# Patient Record
Sex: Male | Born: 1995 | Race: White | Hispanic: No | Marital: Single | Smoking: Current every day smoker
Health system: Southern US, Community
[De-identification: ages and names within clinical notes are randomized; demographics above are authoritative.]

## PROBLEM LIST (undated history)

## (undated) DIAGNOSIS — F411 Generalized anxiety disorder: Secondary | ICD-10-CM

## (undated) DIAGNOSIS — F1910 Other psychoactive substance abuse, uncomplicated: Secondary | ICD-10-CM

---

## 2017-09-12 ENCOUNTER — Emergency Department (HOSPITAL_COMMUNITY)
Admission: EM | Admit: 2017-09-12 | Discharge: 2017-09-12 | Disposition: A | Discharge status: Home / Self Care | Payer: BLUE CROSS/BLUE SHIELD | Attending: Emergency Medicine | Admitting: Emergency Medicine

## 2017-09-12 ENCOUNTER — Emergency Department (HOSPITAL_COMMUNITY): Payer: BLUE CROSS/BLUE SHIELD

## 2017-09-12 ENCOUNTER — Encounter (HOSPITAL_COMMUNITY): Payer: Self-pay | Admitting: Emergency Medicine

## 2017-09-12 DIAGNOSIS — B9789 Other viral agents as the cause of diseases classified elsewhere: Secondary | ICD-10-CM

## 2017-09-12 DIAGNOSIS — Z5321 Procedure and treatment not carried out due to patient leaving prior to being seen by health care provider: Secondary | ICD-10-CM | POA: Insufficient documentation

## 2017-09-12 DIAGNOSIS — J069 Acute upper respiratory infection, unspecified: Secondary | ICD-10-CM

## 2017-09-12 DIAGNOSIS — R05 Cough: Secondary | ICD-10-CM | POA: Diagnosis not present

## 2017-09-12 HISTORY — DX: Generalized anxiety disorder: F41.1

## 2017-09-12 MED ORDER — SODIUM CHLORIDE 0.9 % IV BOLUS (SEPSIS): 1000.0000 mL | Freq: Once | INTRAVENOUS | Status: DC

## 2017-09-12 MED ORDER — BENZONATATE 100 MG PO CAPS: 100.0000 mg | ORAL_CAPSULE | Freq: Once | ORAL | Status: DC

## 2017-09-12 MED ORDER — ONDANSETRON HCL 4 MG/2ML IJ SOLN: 4.0000 mg | Freq: Once | INTRAMUSCULAR | Status: DC

## 2017-09-12 MED ORDER — KETOROLAC TROMETHAMINE 30 MG/ML IJ SOLN: 30.0000 mg | Freq: Once | INTRAMUSCULAR | Status: DC

## 2017-09-12 NOTE — ED Provider Notes (Signed)
[facility]-EMERGENCY DEPT Provider Note   CSN: [id number] Arrival date & time: 09/12/17  4098     History   Chief Complaint Chief Complaint  Patient presents with  . Nausea  . Nasal Congestion  . Migraine    HPI Matthew Hale is a 21 y.o. male.  HPI Matthew Hale is a 21 y.o. male with hx of anxiety, presents to ED with complaint of a headache, nausea, vomiting, congestion, sore throat cough. States symptoms began yesterday. Reports night sweats. Unable to keep fluids down this morning. Reports diffuse headache, dizziness, states "i am seeing black spots." no neck pain or stiffness. Did not take any meds prior to coming in. Nothing making symptoms better or worse. Tearful, states "i am very scared about what this may be."   Past Medical History:  Diagnosis Date  . Generalized anxiety disorder     There are no active problems to display for this patient.   History reviewed. No pertinent surgical history.     Home Medications    Prior to Admission medications   Medication Sig Start Date End Date Taking? Authorizing Provider  amphetamine-dextroamphetamine (ADDERALL) 10 MG tablet Take 10-20 mg by mouth 2 (two) times daily. Take  by mouth in the morning and  by mouth at 2pm 06/19/17  Yes [provider]  Pseudoephedrine-Ibuprofen (ADVIL COLD & SINUS LIQUI-GELS) 30-200 MG CAPS Take 2 capsules by mouth every 6 (six) hours as needed (cold, congestion).   Yes [provider]    Family History History reviewed. No pertinent family history.  Social History Social History  Substance Use Topics  . Smoking status: Current Every Day Smoker    Packs/day: 0.50    Types: Cigarettes  . Smokeless tobacco: Never Used  . Alcohol use Yes     Allergies   Patient has no known allergies.   Review of Systems Review of Systems  Constitutional: Positive for chills, diaphoresis and fever.  HENT: Positive for congestion and sore throat.   Eyes: Negative for  photophobia.  Respiratory: Positive for cough, chest tightness and shortness of breath.   Cardiovascular: Positive for chest pain. Negative for palpitations and leg swelling.  Gastrointestinal: Positive for abdominal pain, nausea and vomiting. Negative for abdominal distention and diarrhea.  Genitourinary: Negative for dysuria, frequency, hematuria and urgency.  Musculoskeletal: Positive for myalgias. Negative for arthralgias, neck pain and neck stiffness.  Skin: Negative for rash.  Allergic/Immunologic: Negative for immunocompromised state.  Neurological: Positive for dizziness, light-headedness and headaches. Negative for weakness and numbness.  All other systems reviewed and are negative.    Physical Exam Updated Vital Signs BP (!) 121/91 (BP Location: Left Arm)   Pulse (!) 121   Temp 98.3 F (36.8 C) (Oral)   Resp 16   SpO2 93%   Physical Exam  Constitutional: He is oriented to person, place, and time. He appears well-developed and well-nourished. No distress.  HENT:  Head: Normocephalic and atraumatic.  Right Ear: External ear normal.  Left Ear: External ear normal.  Mouth/Throat: Oropharynx is clear and moist.  Clear rhinorrhea, pharynx erythemous, uvula midline  Eyes: Conjunctivae are normal.  Neck: Normal range of motion. Neck supple.  No meningeal signs  Cardiovascular: Normal rate, regular rhythm and normal heart sounds.   Pulmonary/Chest: Effort normal and breath sounds normal. No respiratory distress. He has no wheezes. He has no rales.  Abdominal: Soft. Bowel sounds are normal. He exhibits no distension. There is no tenderness. There is no rebound.  Musculoskeletal:  He exhibits no edema or tenderness.  Lymphadenopathy:    He has no cervical adenopathy.  Neurological: He is alert and oriented to person, place, and time.  Skin: Skin is warm and dry. No erythema.  Psychiatric: He has a normal mood and affect.  Nursing note and vitals reviewed.    ED Treatments  / Results  Labs (all labs ordered are listed, but only abnormal results are displayed) Labs Reviewed  CBC WITH DIFFERENTIAL/PLATELET  INFLUENZA PANEL BY PCR (TYPE A & B)  I-STAT CHEM 8, ED    EKG  EKG Interpretation None       Radiology No results found.  Procedures Procedures (including critical care time)  Medications Ordered in ED Medications  sodium chloride 0.9 % bolus 1,000 mL (not administered)  benzonatate (TESSALON) capsule 100 mg (not administered)  ketorolac (TORADOL) 30 MG/ML injection 30 mg (not administered)     Initial Impression / Assessment and Plan / ED Course  I have reviewed the triage vital signs and the nursing notes.  Pertinent labs & imaging results that were available during my care of the patient were reviewed by me and considered in my medical decision making (see chart for details).     Pt with flu like symptoms. Very tearful, appears anxious. tachycardic with HR in 120s. Will give iv fluids, check basic electrolytes. Give toradol for headache, zofran for nausea, tessalon for cough.   9:54 AM Patient is in the hallway, screening that he's getting a poor service. I went in to speak with the patient, he apparently is upset because his nurse came in and was "rude" to him. He stated that she went ahead to draw labs and fluids as I ordered, however he decided he did not want this test done. After he told her to the nurse, he stated that the nurse laughed at him and walked out of the room. He is asking to speak with a service representative because we are providing him poor service. I tried to explain to him that the nurse was probably not rude but just that she walked out because he refused testing came to talk to me. Patient is upset, but I tried to calm him down and he did initially decide to stay and get some testing done.  I asked the charge nurse to come and talk to him, she did go in and speak with the patient and again after talking to her, he  refused all the testing and decided to leave the ED with no treatment.  Vitals:   09/12/17 0830  BP: (!) 121/91  Pulse: (!) 121  Resp: 16  Temp: 98.3 F (36.8 C)  TempSrc: Oral  SpO2: 93%     Final Clinical Impressions(s) / ED Diagnoses   Final diagnoses:  Viral URI with cough    New Prescriptions New Prescriptions   No medications on file     Jaynie Crumble, PA-C 09/12/17 1610    Rolan Bucco, MD 09/12/17 1000

## 2017-09-12 NOTE — ED Triage Notes (Signed)
Pt with cough and sinus congestion x several days. Pt states he had nausea / vomiting since yesterday, chills. Pt reports productive cough with yellow sputum.

## 2017-09-12 NOTE — ED Notes (Signed)
PATIENT LEFT WITHOUT BEING SEEN

## 2017-09-12 NOTE — ED Notes (Signed)
Pt refused IV and blood draw and wanting to leave per Gabriel Earing, Charity fundraiser. Tatyana, PA enter pt room explaining risks leaving AMA. This Clinical research associate entered pt room to address pt concerns. Pt verbalizes "does not want care because it is expressive; that is way I didn't want the blood drawn; they didn't listen to me." Pt made aware financials can be arranged at a later time; that he care is important at this moment. Pt not easily redirected and continues to verbalize "but they did not listen and were rude." Pt continues to refuse care; pt left AMA.

## 2017-09-22 ENCOUNTER — Emergency Department (HOSPITAL_BASED_OUTPATIENT_CLINIC_OR_DEPARTMENT_OTHER): Payer: BLUE CROSS/BLUE SHIELD

## 2017-09-22 ENCOUNTER — Encounter (HOSPITAL_BASED_OUTPATIENT_CLINIC_OR_DEPARTMENT_OTHER): Payer: Self-pay | Admitting: Emergency Medicine

## 2017-09-22 ENCOUNTER — Emergency Department (HOSPITAL_BASED_OUTPATIENT_CLINIC_OR_DEPARTMENT_OTHER)
Admission: EM | Admit: 2017-09-22 | Discharge: 2017-09-22 | Disposition: A | Discharge status: Home / Self Care | Payer: BLUE CROSS/BLUE SHIELD | Attending: Emergency Medicine | Admitting: Emergency Medicine

## 2017-09-22 DIAGNOSIS — Z79899 Other long term (current) drug therapy: Secondary | ICD-10-CM | POA: Insufficient documentation

## 2017-09-22 DIAGNOSIS — J40 Bronchitis, not specified as acute or chronic: Secondary | ICD-10-CM | POA: Diagnosis not present

## 2017-09-22 DIAGNOSIS — R05 Cough: Secondary | ICD-10-CM | POA: Diagnosis present

## 2017-09-22 DIAGNOSIS — F17210 Nicotine dependence, cigarettes, uncomplicated: Secondary | ICD-10-CM | POA: Diagnosis not present

## 2017-09-22 LAB — CBC WITH DIFFERENTIAL/PLATELET
BASOS ABS: 0 10*3/uL (ref 0.0–0.1)
BASOS PCT: 0 %
Eosinophils Absolute: 0.1 10*3/uL (ref 0.0–0.7)
Eosinophils Relative: 1 %
HEMATOCRIT: 44 % (ref 39.0–52.0)
HEMOGLOBIN: 15.3 g/dL (ref 13.0–17.0)
LYMPHS PCT: 14 %
Lymphs Abs: 2.2 10*3/uL (ref 0.7–4.0)
MCH: 31.2 pg (ref 26.0–34.0)
MCHC: 34.8 g/dL (ref 30.0–36.0)
MCV: 89.6 fL (ref 78.0–100.0)
MONO ABS: 0.7 10*3/uL (ref 0.1–1.0)
Monocytes Relative: 5 %
NEUTROS ABS: 13.2 10*3/uL — AB (ref 1.7–7.7)
NEUTROS PCT: 80 %
Platelets: 270 10*3/uL (ref 150–400)
RBC: 4.91 MIL/uL (ref 4.22–5.81)
RDW: 12.2 % (ref 11.5–15.5)
WBC: 16.4 10*3/uL — AB (ref 4.0–10.5)

## 2017-09-22 MED ORDER — ONDANSETRON HCL 4 MG PO TABS: 4.0000 mg | ORAL_TABLET | Freq: Three times a day (TID) | ORAL | 0 refills | Status: DC | PRN

## 2017-09-22 MED ORDER — ALBUTEROL SULFATE HFA 108 (90 BASE) MCG/ACT IN AERS
1.0000 | INHALATION_SPRAY | RESPIRATORY_TRACT | Status: DC | PRN
  Administered 2017-09-22: 2 via RESPIRATORY_TRACT
  Filled 2017-09-22: qty 6.7

## 2017-09-22 MED ORDER — BENZONATATE 100 MG PO CAPS: 100.0000 mg | ORAL_CAPSULE | Freq: Three times a day (TID) | ORAL | 0 refills | Status: DC

## 2017-09-22 MED ORDER — IPRATROPIUM-ALBUTEROL 0.5-2.5 (3) MG/3ML IN SOLN
3.0000 mL | Freq: Once | RESPIRATORY_TRACT | Status: AC
  Administered 2017-09-22: 3 mL via RESPIRATORY_TRACT
  Filled 2017-09-22: qty 3

## 2017-09-22 MED ORDER — ONDANSETRON 8 MG PO TBDP
8.0000 mg | ORAL_TABLET | Freq: Once | ORAL | Status: AC
  Administered 2017-09-22: 8 mg via ORAL
  Filled 2017-09-22: qty 1

## 2017-09-22 NOTE — Discharge Instructions (Signed)
Please read and follow all provided instructions.  Your diagnoses today include:  1. Bronchitis     Tests performed today include: Vital signs. See below for your results today.  CXR - no signs of bacterial infection  Blood counts - reassuring  Medications prescribed/advised:  1. Musinex [Guaifenesin] as a decongestant [thin mucus - you have to be well hydrated when taking this for it to work] - can get over the counter 2. Tylenol for fever/pain and Motrin/Ibuprofen for muscle aches  3. Albuterol inhaler - this medication will help open up your airway Use inhaler as follows: 1-2 puffs with spacer every 4 hours as needed for wheezing, cough, or shortness of breath.  4. Tessalon Cough Suppressant: Take as prescribed.  5. Anti-nausea medication (Zofran): Take as needed for nausea up to every 8 hours.   Home care instructions:  An upper respiratory infection (URI) is also sometimes known as the common cold. Most people improve within 1 week, but symptoms can last up to 2 weeks. A residual cough may last even longer.   URI is most commonly caused by a virus. Viruses are NOT treated with antibiotics. You can easily spread the virus to others by oral contact. This includes kissing, sharing a glass, coughing, or sneezing. Touching your mouth or nose and then touching a surface, which is then touched by another person, can also spread the virus.   TREATMENT  Treatment is directed at relieving symptoms. There is no cure. Antibiotics are not effective, because the infection is caused by a virus, not by bacteria. Treatment may include:  Increased fluid intake. Sports drinks offer valuable electrolytes, sugars, and fluids.  Breathing heated mist or steam (vaporizer or shower).  Eating chicken soup or other clear broths, and maintaining good nutrition.  Getting plenty of rest.  Using gargles or lozenges for comfort.  Controlling fevers with ibuprofen or acetaminophen as directed by your caregiver.   Increasing usage of your inhaler if you have asthma.  Return to work when your temperature has returned to normal.   Follow-up instructions: Followup with your primary care doctor in 4 days if your symptoms persist.  Your more than welcome to return to the emergency department if symptoms worsen or become concerning.  Return instructions:  Please return to the Emergency Department if you do not get better, if you get worse, or new symptoms OR  - Fever (temperature greater than 101.26F)  - Bleeding that does not stop with holding pressure to the area    -Severe pain (please note that you may be more sore the day after your accident)  - Chest Pain  - Difficulty breathing (worsening shortness of breath with sputum production may  be a sign of pneumonia.   - Severe nausea or vomiting  - Inability to tolerate food and liquids  - Passing out  - Skin becoming red around your wounds  - Change in mental status (confusion or lethargy)  - New numbness or weakness     -You develop fever, swollen neck glands, pain with swallowing or white areas on the back of your throat. This may be a sign of strep throat.  Please return if you have any other emergent concerns.  Additional Information:  Your vital signs today were: BP 125/74 (BP Location: Left Arm)    Pulse 60    Temp 97.7 F (36.5 C) (Oral)    Resp 16    Ht  (1.778 m)    Wt 96.2 kg (212 lb)  SpO2 100%    BMI 30.42 kg/m  If your blood pressure (BP) was elevated above 135/85 this visit, please have this repeated by your doctor within one month.

## 2017-09-22 NOTE — ED Notes (Signed)
Patient transported to X-ray 

## 2017-09-22 NOTE — ED Triage Notes (Addendum)
Pt with cough and sinus congestion x several days, patient states that he went to his dr earlier this week and was given multiple RX but has not had the money to pick it up. He can not remember what they were. He reports that he was coughing today and had some blood in his sputum x 1 today  - Patient it very anxious and restless in triage. Reports that he was not sure if the blood was coming from his sputum or from his stomach. Patient is dry heaving in triage. PAtient vomiting  / coughing in triage and states that he is gagging from the sputum

## 2017-09-22 NOTE — ED Notes (Signed)
Pt given d/c instructions as per chart. Rx x 2. Verbalizes understanding. No questions. 

## 2017-09-22 NOTE — ED Provider Notes (Signed)
[facility]   CSN: [id number] Arrival date & time: 09/22/17  1928     History   Chief Complaint Chief Complaint  Patient presents with  . Cough    HPI Matthew Hale is a 21 y.o. male presents to the emergency department today for a 10 day history of cough. Was seen in the emergency department on 09/12/17 but left before getting complete workup. States that he has had productive cough with clear/green sputum, sinus congestion, rhinorrhea since 09/12/17. Was seen by his PCP for this on Thursday and was given albuterol inhaler for wheezing but was unable to fill the medication due to costs. Today patient cough turned to retching and he has had 1-2 episodes of blood streaked sputum afterlong episodes of dry heaving. He says that he has had nausea from increased sputum which has been causing him to gag. The patient has not had bloody nose, abdominal pain, emesis. Denies fever, chills, myalgia's, arthralgia's, night sweats, weight loss, travel, chest pain, SOB, DOE, lower extremity swelling, recent surgery, history of cancer, or recent immobilization. 1/2 pack smoker daily. No new medications or use of ACE-I. No relation to food. No history of asthma, COPD, or CHF. Patient is not immunocompromised (HIV, chronic steroid use, etc.). No IVDU.   HPI  Past Medical History:  Diagnosis Date  . Generalized anxiety disorder     There are no active problems to display for this patient.   History reviewed. No pertinent surgical history.     Home Medications    Prior to Admission medications   Medication Sig Start Date End Date Taking? Authorizing Provider  amphetamine-dextroamphetamine (ADDERALL) 10 MG tablet Take 10-20 mg by mouth 2 (two) times daily. Take  by mouth in the morning and  by mouth at 2pm 06/19/17   [provider]  Pseudoephedrine-Ibuprofen (ADVIL COLD & SINUS LIQUI-GELS) 30-200 MG CAPS Take 2 capsules by mouth every 6 (six) hours as  needed (cold, congestion).    [provider]    Family History History reviewed. No pertinent family history.  Social History Social History  Substance Use Topics  . Smoking status: Current Every Day Smoker    Packs/day: 0.50    Types: Cigarettes  . Smokeless tobacco: Never Used  . Alcohol use Yes     Allergies   Patient has no known allergies.   Review of Systems Review of Systems  All other systems reviewed and are negative.    Physical Exam Updated Vital Signs BP (!) 114/99 (BP Location: Left Arm)   Pulse 80   Temp 97.7 F (36.5 C) (Oral)   Resp 18   Ht  (1.778 m)   Wt 96.2 kg (212 lb)   SpO2 98%   BMI 30.42 kg/m   Physical Exam  Constitutional: He appears well-developed and well-nourished.  HENT:  Head: Normocephalic and atraumatic.  Right Ear: Hearing, tympanic membrane, external ear and ear canal normal.  Left Ear: Hearing, tympanic membrane, external ear and ear canal normal.  Nose: Mucosal edema and rhinorrhea present. Right sinus exhibits no maxillary sinus tenderness and no frontal sinus tenderness. Left sinus exhibits no maxillary sinus tenderness and no frontal sinus tenderness.  Mouth/Throat: Uvula is midline, oropharynx is clear and moist and mucous membranes are normal. No oropharyngeal exudate, posterior oropharyngeal edema, posterior oropharyngeal erythema or tonsillar abscesses. No tonsillar exudate.  [place]  Eyes: Pupils are equal, round, and reactive to light. Right eye exhibits no discharge. Left eye exhibits no  discharge. No scleral icterus.  Neck: Trachea normal. Neck supple. No spinous process tenderness present. No neck rigidity. Normal range of motion present.  No meningismus  Cardiovascular: Normal rate, regular rhythm and intact distal pulses.   No murmur heard. Pulses:      Radial pulses are 2+ on the right side, and 2+ on the left side.       Dorsalis pedis pulses are 2+ on the right side, and 2+ on the left  side.       Posterior tibial pulses are 2+ on the right side, and 2+ on the left side.  No lower extremity swelling or edema. Calves symmetric in size bilaterally.  Pulmonary/Chest: Effort normal. He has no decreased breath sounds. He has wheezes (expiratory). He has no rhonchi. He has no rales. He exhibits no tenderness.  Abdominal: Soft. Bowel sounds are normal. There is no tenderness. There is no rebound and no guarding.  Musculoskeletal: He exhibits no edema.  Lymphadenopathy:    He has no cervical adenopathy.  Neurological: He is alert.  Skin: Skin is warm and dry. No rash noted. He is not diaphoretic.  No petechiae or purpura  Psychiatric: He has a normal mood and affect.  Nursing note and vitals reviewed.    ED Treatments / Results  Labs (all labs ordered are listed, but only abnormal results are displayed) Labs Reviewed - No data to display  EKG  EKG Interpretation None       Radiology Dg Chest 2 View  Result Date: 09/22/2017 CLINICAL DATA:  Productive cough with some blood in it today. EXAM: CHEST  2 VIEW COMPARISON:  None. FINDINGS: The lungs are clear. The pulmonary vasculature is normal. Heart size is normal. Hilar and mediastinal contours are unremarkable. There is no pleural effusion. IMPRESSION: No active cardiopulmonary disease. Electronically Signed   By: Ellery Plunk M.D.   On: 09/22/2017 20:12    Procedures Procedures (including critical care time)  Medications Ordered in ED Medications - No data to display   Initial Impression / Assessment and Plan / ED Course  I have reviewed the triage vital signs and the nursing notes.  Pertinent labs & imaging results that were available during my care of the patient were reviewed by me and considered in my medical decision making (see chart for details).     Patient here with cough and sinus congestion x 10 days. Was seen day symptoms began but left before getting workup. Patient seen by PCP earlier in  the week and given inhaler rx but could not fill it due to cost. Today notes nausea, retching and 1-2 bouts of small amounts of blood in sputum. Vital signs are reassuring. The patient does have some wheezing that is appreciated on exam. There is no meningismus on exam. No petechiae or purpura on skin exam. No abdominal TTP. Patient given breathing treatment with improvement of wheezing. Zofran improved nausea and no more episodes of retching or hemoptysis while patient in department. CBC without anemia and platelet count wnl. CXR negative. Blood likely due to small Mallory-Weiss tears from retching. Will send patient home with nausea control and symptomatic therapy for viral bronchitis. Albuterol inhaler given in the department. The evaluation does not show pathology that would require ongoing emergent intervention or inpatient treatment. I advised the patient to follow-up with PCP this week. I advised the patient to return to the emergency department with new or worsening symptoms or new concerns. Specific return precautions discussed. The patient verbalized understanding  and agreement with plan. All questions answered. No further questions at this time. The patient is hemodynamically stable, mentating appropriately and appears safe for discharge.  Patient case discussed with Dr. Rubin Payor who is in agreement with plan.  Final Clinical Impressions(s) / ED Diagnoses   Final diagnoses:  Bronchitis    New Prescriptions New Prescriptions   No medications on file     Princella Pellegrini 09/23/17 1456    Benjiman Core, MD 09/23/17 2214

## 2018-03-11 ENCOUNTER — Inpatient Hospital Stay (HOSPITAL_COMMUNITY)
Admission: EM | Admit: 2018-03-11 | Discharge: 2018-03-15 | DRG: 964 | Disposition: A | Discharge status: Home / Self Care | Payer: BLUE CROSS/BLUE SHIELD | Attending: General Surgery | Admitting: General Surgery

## 2018-03-11 ENCOUNTER — Emergency Department (HOSPITAL_COMMUNITY): Payer: BLUE CROSS/BLUE SHIELD

## 2018-03-11 ENCOUNTER — Encounter (HOSPITAL_COMMUNITY): Payer: Self-pay | Admitting: Radiology

## 2018-03-11 DIAGNOSIS — S36115A Moderate laceration of liver, initial encounter: Principal | ICD-10-CM | POA: Diagnosis present

## 2018-03-11 DIAGNOSIS — R402142 Coma scale, eyes open, spontaneous, at arrival to emergency department: Secondary | ICD-10-CM | POA: Diagnosis present

## 2018-03-11 DIAGNOSIS — F4310 Post-traumatic stress disorder, unspecified: Secondary | ICD-10-CM | POA: Diagnosis present

## 2018-03-11 DIAGNOSIS — J942 Hemothorax: Secondary | ICD-10-CM

## 2018-03-11 DIAGNOSIS — S299XXA Unspecified injury of thorax, initial encounter: Secondary | ICD-10-CM

## 2018-03-11 DIAGNOSIS — S271XXA Traumatic hemothorax, initial encounter: Secondary | ICD-10-CM | POA: Diagnosis present

## 2018-03-11 DIAGNOSIS — S31119A Laceration without foreign body of abdominal wall, unspecified quadrant without penetration into peritoneal cavity, initial encounter: Secondary | ICD-10-CM | POA: Diagnosis present

## 2018-03-11 DIAGNOSIS — D62 Acute posthemorrhagic anemia: Secondary | ICD-10-CM | POA: Diagnosis present

## 2018-03-11 DIAGNOSIS — R0602 Shortness of breath: Secondary | ICD-10-CM

## 2018-03-11 DIAGNOSIS — S21231A Puncture wound without foreign body of right back wall of thorax without penetration into thoracic cavity, initial encounter: Secondary | ICD-10-CM | POA: Diagnosis not present

## 2018-03-11 DIAGNOSIS — F411 Generalized anxiety disorder: Secondary | ICD-10-CM | POA: Diagnosis present

## 2018-03-11 DIAGNOSIS — R402362 Coma scale, best motor response, obeys commands, at arrival to emergency department: Secondary | ICD-10-CM | POA: Diagnosis present

## 2018-03-11 DIAGNOSIS — F17210 Nicotine dependence, cigarettes, uncomplicated: Secondary | ICD-10-CM | POA: Diagnosis present

## 2018-03-11 DIAGNOSIS — Z9689 Presence of other specified functional implants: Secondary | ICD-10-CM

## 2018-03-11 DIAGNOSIS — R402242 Coma scale, best verbal response, confused conversation, at arrival to emergency department: Secondary | ICD-10-CM | POA: Diagnosis present

## 2018-03-11 DIAGNOSIS — S80212A Abrasion, left knee, initial encounter: Secondary | ICD-10-CM | POA: Diagnosis present

## 2018-03-11 DIAGNOSIS — F429 Obsessive-compulsive disorder, unspecified: Secondary | ICD-10-CM | POA: Diagnosis present

## 2018-03-11 LAB — I-STAT CHEM 8, ED
BUN: 9 mg/dL (ref 6–20)
CALCIUM ION: 1.14 mmol/L — AB (ref 1.15–1.40)
Chloride: 104 mmol/L (ref 101–111)
Creatinine, Ser: 1.1 mg/dL (ref 0.61–1.24)
GLUCOSE: 173 mg/dL — AB (ref 65–99)
HCT: 42 % (ref 39.0–52.0)
Hemoglobin: 14.3 g/dL (ref 13.0–17.0)
Potassium: 3.8 mmol/L (ref 3.5–5.1)
SODIUM: 138 mmol/L (ref 135–145)
TCO2: 20 mmol/L — AB (ref 22–32)

## 2018-03-11 LAB — CBC
HCT: 40.4 % (ref 39.0–52.0)
Hemoglobin: 13.7 g/dL (ref 13.0–17.0)
MCH: 30.6 pg (ref 26.0–34.0)
MCHC: 33.9 g/dL (ref 30.0–36.0)
MCV: 90.2 fL (ref 78.0–100.0)
Platelets: 254 10*3/uL (ref 150–400)
RBC: 4.48 MIL/uL (ref 4.22–5.81)
RDW: 12.7 % (ref 11.5–15.5)
WBC: 7.9 10*3/uL (ref 4.0–10.5)

## 2018-03-11 LAB — PROTIME-INR
INR: 1.05
Prothrombin Time: 13.6 seconds (ref 11.4–15.2)

## 2018-03-11 LAB — I-STAT CG4 LACTIC ACID, ED: LACTIC ACID, VENOUS: 7.92 mmol/L — AB (ref 0.5–1.9)

## 2018-03-11 MED ORDER — IOPAMIDOL (ISOVUE-300) INJECTION 61%
INTRAVENOUS | Status: AC
  Filled 2018-03-11: qty 100

## 2018-03-11 MED ORDER — IOPAMIDOL (ISOVUE-300) INJECTION 61%
100.0000 mL | Freq: Once | INTRAVENOUS | Status: AC | PRN
  Administered 2018-03-11: 100 mL via INTRAVENOUS

## 2018-03-11 MED ORDER — SODIUM CHLORIDE 0.9 % IV BOLUS
1000.0000 mL | Freq: Once | INTRAVENOUS | Status: AC
  Administered 2018-03-11: 1000 mL via INTRAVENOUS
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
## 2018-03-12 ENCOUNTER — Inpatient Hospital Stay (HOSPITAL_COMMUNITY): Payer: BLUE CROSS/BLUE SHIELD

## 2018-03-12 DIAGNOSIS — F411 Generalized anxiety disorder: Secondary | ICD-10-CM | POA: Diagnosis not present

## 2018-03-12 DIAGNOSIS — S36115A Moderate laceration of liver, initial encounter: Secondary | ICD-10-CM | POA: Diagnosis not present

## 2018-03-12 DIAGNOSIS — S271XXA Traumatic hemothorax, initial encounter: Secondary | ICD-10-CM | POA: Diagnosis not present

## 2018-03-12 DIAGNOSIS — F429 Obsessive-compulsive disorder, unspecified: Secondary | ICD-10-CM | POA: Diagnosis not present

## 2018-03-12 DIAGNOSIS — R402362 Coma scale, best motor response, obeys commands, at arrival to emergency department: Secondary | ICD-10-CM | POA: Diagnosis not present

## 2018-03-12 DIAGNOSIS — R402142 Coma scale, eyes open, spontaneous, at arrival to emergency department: Secondary | ICD-10-CM | POA: Diagnosis not present

## 2018-03-12 DIAGNOSIS — S31119A Laceration without foreign body of abdominal wall, unspecified quadrant without penetration into peritoneal cavity, initial encounter: Secondary | ICD-10-CM | POA: Diagnosis present

## 2018-03-12 DIAGNOSIS — R402242 Coma scale, best verbal response, confused conversation, at arrival to emergency department: Secondary | ICD-10-CM | POA: Diagnosis not present

## 2018-03-12 DIAGNOSIS — D62 Acute posthemorrhagic anemia: Secondary | ICD-10-CM | POA: Diagnosis not present

## 2018-03-12 DIAGNOSIS — F4310 Post-traumatic stress disorder, unspecified: Secondary | ICD-10-CM | POA: Diagnosis not present

## 2018-03-12 DIAGNOSIS — S21231A Puncture wound without foreign body of right back wall of thorax without penetration into thoracic cavity, initial encounter: Secondary | ICD-10-CM | POA: Diagnosis present

## 2018-03-12 DIAGNOSIS — S80212A Abrasion, left knee, initial encounter: Secondary | ICD-10-CM | POA: Diagnosis not present

## 2018-03-12 DIAGNOSIS — F17210 Nicotine dependence, cigarettes, uncomplicated: Secondary | ICD-10-CM | POA: Diagnosis not present

## 2018-03-12 LAB — TYPE AND SCREEN
ABO/RH(D): O POS
ABO/RH(D): O POS
ANTIBODY SCREEN: NEGATIVE
Antibody Screen: NEGATIVE

## 2018-03-12 LAB — URINALYSIS, ROUTINE W REFLEX MICROSCOPIC
Bilirubin Urine: NEGATIVE
GLUCOSE, UA: NEGATIVE mg/dL
Hgb urine dipstick: NEGATIVE
Ketones, ur: 5 mg/dL — AB
LEUKOCYTES UA: NEGATIVE
Nitrite: NEGATIVE
PROTEIN: NEGATIVE mg/dL
Specific Gravity, Urine: 1.039 — ABNORMAL HIGH (ref 1.005–1.030)
pH: 8 (ref 5.0–8.0)

## 2018-03-12 LAB — HIV ANTIBODY (ROUTINE TESTING W REFLEX): HIV SCREEN 4TH GENERATION: NONREACTIVE

## 2018-03-12 LAB — COMPREHENSIVE METABOLIC PANEL
ALBUMIN: 4.3 g/dL (ref 3.5–5.0)
ALK PHOS: 62 U/L (ref 38–126)
ALT: 18 U/L (ref 17–63)
ALT: 18 U/L (ref 17–63)
AST: 27 U/L (ref 15–41)
AST: 21 U/L (ref 15–41)
Albumin: 3.4 g/dL — ABNORMAL LOW (ref 3.5–5.0)
Alkaline Phosphatase: 84 U/L (ref 38–126)
Anion gap: 15 (ref 5–15)
Anion gap: 11 (ref 5–15)
BILIRUBIN TOTAL: 0.9 mg/dL (ref 0.3–1.2)
BUN: 11 mg/dL (ref 6–20)
BUN: <5 mg/dL — ABNORMAL LOW (ref 6–20)
CALCIUM: 8.3 mg/dL — AB (ref 8.9–10.3)
CHLORIDE: 112 mmol/L — AB (ref 101–111)
CHLORIDE: 102 mmol/L (ref 101–111)
CO2: 20 mmol/L — ABNORMAL LOW (ref 22–32)
CO2: 23 mmol/L (ref 22–32)
CREATININE: 1.03 mg/dL (ref 0.61–1.24)
Calcium: 9.5 mg/dL (ref 8.9–10.3)
Creatinine, Ser: 1.24 mg/dL (ref 0.61–1.24)
GFR calc Af Amer: >60 mL/min (ref >60)
GLUCOSE: 171 mg/dL — AB (ref 65–99)
Glucose, Bld: 92 mg/dL (ref 65–99)
POTASSIUM: 4.1 mmol/L (ref 3.5–5.1)
Potassium: 3.5 mmol/L (ref 3.5–5.1)
Sodium: 147 mmol/L — ABNORMAL HIGH (ref 135–145)
Sodium: 136 mmol/L (ref 135–145)
TOTAL PROTEIN: 7.5 g/dL (ref 6.5–8.1)
TOTAL PROTEIN: 5.5 g/dL — AB (ref 6.5–8.1)
Total Bilirubin: 0.5 mg/dL (ref 0.3–1.2)

## 2018-03-12 LAB — CBC
HCT: 33.4 % — ABNORMAL LOW (ref 39.0–52.0)
HCT: 26.9 % — ABNORMAL LOW (ref 39.0–52.0)
HEMOGLOBIN: 8.8 g/dL — AB (ref 13.0–17.0)
Hemoglobin: 11 g/dL — ABNORMAL LOW (ref 13.0–17.0)
MCH: 29.7 pg (ref 26.0–34.0)
MCH: 29.4 pg (ref 26.0–34.0)
MCHC: 32.9 g/dL (ref 30.0–36.0)
MCHC: 32.7 g/dL (ref 30.0–36.0)
MCV: 90.3 fL (ref 78.0–100.0)
MCV: 90 fL (ref 78.0–100.0)
PLATELETS: 196 10*3/uL (ref 150–400)
Platelets: 154 10*3/uL (ref 150–400)
RBC: 3.7 MIL/uL — ABNORMAL LOW (ref 4.22–5.81)
RBC: 2.99 MIL/uL — AB (ref 4.22–5.81)
RDW: 12.8 % (ref 11.5–15.5)
RDW: 12.9 % (ref 11.5–15.5)
WBC: 8.6 10*3/uL (ref 4.0–10.5)
WBC: 5.5 10*3/uL (ref 4.0–10.5)

## 2018-03-12 LAB — MRSA PCR SCREENING: MRSA BY PCR: NEGATIVE

## 2018-03-12 LAB — ABO/RH
ABO/RH(D): O POS
ABO/RH(D): O POS

## 2018-03-12 LAB — RAPID URINE DRUG SCREEN, HOSP PERFORMED
AMPHETAMINES: NOT DETECTED
BARBITURATES: NOT DETECTED
BENZODIAZEPINES: NOT DETECTED
Cocaine: POSITIVE — AB
Opiates: POSITIVE — AB
Tetrahydrocannabinol: POSITIVE — AB

## 2018-03-12 LAB — LACTIC ACID, PLASMA: Lactic Acid, Venous: 0.8 mmol/L (ref 0.5–1.9)

## 2018-03-12 LAB — ETHANOL

## 2018-03-12 LAB — CDS SEROLOGY

## 2018-03-12 MED ORDER — MIDAZOLAM HCL 2 MG/2ML IJ SOLN
1.0000 mg | Freq: Once | INTRAMUSCULAR | Status: AC
  Administered 2018-03-12: 1 mg via INTRAVENOUS
  Filled 2018-03-12: qty 2

## 2018-03-12 MED ORDER — OXYCODONE HCL 5 MG PO TABS
10.0000 mg | ORAL_TABLET | ORAL | Status: DC | PRN
  Administered 2018-03-12 – 2018-03-15 (×9): 10 mg via ORAL
  Filled 2018-03-12 (×10): qty 2

## 2018-03-12 MED ORDER — OXYCODONE HCL 5 MG PO TABS
5.0000 mg | ORAL_TABLET | ORAL | Status: DC | PRN
  Administered 2018-03-15: 5 mg via ORAL
  Filled 2018-03-12: qty 1

## 2018-03-12 MED ORDER — MIDAZOLAM HCL 2 MG/2ML IJ SOLN
2.0000 mg | Freq: Once | INTRAMUSCULAR | Status: AC
  Administered 2018-03-12: 2 mg via INTRAVENOUS

## 2018-03-12 MED ORDER — ONDANSETRON HCL 4 MG/2ML IJ SOLN: 4.0000 mg | Freq: Four times a day (QID) | INTRAMUSCULAR | Status: DC | PRN

## 2018-03-12 MED ORDER — FENTANYL CITRATE (PF) 100 MCG/2ML IJ SOLN
100.0000 ug | Freq: Once | INTRAMUSCULAR | Status: AC
  Administered 2018-03-12: 50 ug via INTRAVENOUS
  Filled 2018-03-12: qty 2

## 2018-03-12 MED ORDER — MORPHINE SULFATE (PF) 2 MG/ML IV SOLN
1.0000 mg | INTRAVENOUS | Status: DC | PRN
  Administered 2018-03-12: 1 mg via INTRAVENOUS
  Filled 2018-03-12: qty 1

## 2018-03-12 MED ORDER — FLUOXETINE HCL 20 MG PO CAPS
20.0000 mg | ORAL_CAPSULE | Freq: Every day | ORAL | Status: DC
  Administered 2018-03-12 – 2018-03-15 (×4): 20 mg via ORAL
  Filled 2018-03-12 (×4): qty 1

## 2018-03-12 MED ORDER — FENTANYL CITRATE (PF) 100 MCG/2ML IJ SOLN
50.0000 ug | Freq: Once | INTRAMUSCULAR | Status: AC
  Administered 2018-03-12: 50 ug via INTRAVENOUS
  Filled 2018-03-12: qty 2

## 2018-03-12 MED ORDER — ACETAMINOPHEN 325 MG PO TABS
650.0000 mg | ORAL_TABLET | Freq: Four times a day (QID) | ORAL | Status: DC
  Administered 2018-03-12 – 2018-03-15 (×15): 650 mg via ORAL
  Filled 2018-03-12 (×15): qty 2

## 2018-03-12 MED ORDER — HYDROMORPHONE HCL 1 MG/ML IJ SOLN
1.0000 mg | INTRAMUSCULAR | Status: DC | PRN
  Administered 2018-03-12: 2 mg via INTRAVENOUS
  Administered 2018-03-12: 1 mg via INTRAVENOUS
  Administered 2018-03-12: 2 mg via INTRAVENOUS
  Administered 2018-03-13: 1 mg via INTRAVENOUS
  Administered 2018-03-13: 2 mg via INTRAVENOUS
  Administered 2018-03-13 – 2018-03-15 (×6): 1 mg via INTRAVENOUS
  Filled 2018-03-12: qty 1
  Filled 2018-03-12: qty 2
  Filled 2018-03-12 (×4): qty 1
  Filled 2018-03-12 (×2): qty 2
  Filled 2018-03-12 (×3): qty 1

## 2018-03-12 MED ORDER — ALPRAZOLAM 0.5 MG PO TABS
0.5000 mg | ORAL_TABLET | Freq: Every day | ORAL | Status: DC | PRN
  Administered 2018-03-12: 0.5 mg via ORAL
  Filled 2018-03-12: qty 1

## 2018-03-12 MED ORDER — METHOCARBAMOL 750 MG PO TABS
750.0000 mg | ORAL_TABLET | Freq: Three times a day (TID) | ORAL | Status: DC | PRN
  Administered 2018-03-12: 750 mg via ORAL
  Filled 2018-03-12: qty 2
  Filled 2018-03-12: qty 1
  Filled 2018-03-12: qty 2
  Filled 2018-03-12: qty 1.5

## 2018-03-12 MED ORDER — DOCUSATE SODIUM 100 MG PO CAPS
100.0000 mg | ORAL_CAPSULE | Freq: Two times a day (BID) | ORAL | Status: DC
  Administered 2018-03-12 – 2018-03-15 (×7): 100 mg via ORAL
  Filled 2018-03-12 (×7): qty 1

## 2018-03-12 MED ORDER — HYDROMORPHONE HCL 1 MG/ML IJ SOLN
1.0000 mg | Freq: Once | INTRAMUSCULAR | Status: AC
  Administered 2018-03-12: 1 mg via INTRAVENOUS
  Filled 2018-03-12: qty 1

## 2018-03-12 MED ORDER — MORPHINE SULFATE (PF) 4 MG/ML IV SOLN: 1.0000 mg | INTRAVENOUS | Status: DC | PRN

## 2018-03-12 MED ORDER — PANTOPRAZOLE SODIUM 40 MG IV SOLR: 40.0000 mg | Freq: Every day | INTRAVENOUS | Status: DC

## 2018-03-12 MED ORDER — SODIUM CHLORIDE 0.9 % IV BOLUS
1000.0000 mL | Freq: Once | INTRAVENOUS | Status: AC
  Administered 2018-03-12: 1000 mL via INTRAVENOUS

## 2018-03-12 MED ORDER — METHOCARBAMOL 500 MG PO TABS
500.0000 mg | ORAL_TABLET | Freq: Three times a day (TID) | ORAL | Status: DC
  Administered 2018-03-12 – 2018-03-15 (×9): 500 mg via ORAL
  Filled 2018-03-12 (×9): qty 1

## 2018-03-12 MED ORDER — PANTOPRAZOLE SODIUM 40 MG PO TBEC
40.0000 mg | DELAYED_RELEASE_TABLET | Freq: Every day | ORAL | Status: DC
  Administered 2018-03-12 – 2018-03-15 (×4): 40 mg via ORAL
  Filled 2018-03-12 (×4): qty 1

## 2018-03-12 MED ORDER — ONDANSETRON 4 MG PO TBDP: 4.0000 mg | ORAL_TABLET | Freq: Four times a day (QID) | ORAL | Status: DC | PRN

## 2018-03-12 MED ORDER — POTASSIUM CHLORIDE IN NACL 20-0.9 MEQ/L-% IV SOLN
INTRAVENOUS | Status: DC
  Administered 2018-03-12 – 2018-03-13 (×3): via INTRAVENOUS
  Filled 2018-03-12 (×3): qty 1000

## 2018-03-12 MED ORDER — ALPRAZOLAM 0.5 MG PO TABS
0.5000 mg | ORAL_TABLET | Freq: Once | ORAL | Status: AC
Start: 2018-03-12 — End: 2018-03-12
  Administered 2018-03-12: 0.5 mg via ORAL
  Filled 2018-03-12: qty 1
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
## 2018-03-12 NOTE — ED Notes (Signed)
[facility] called for transport. 

## 2018-03-13 ENCOUNTER — Inpatient Hospital Stay (HOSPITAL_COMMUNITY): Payer: BLUE CROSS/BLUE SHIELD

## 2018-03-13 LAB — CBC
HCT: 28 % — ABNORMAL LOW (ref 39.0–52.0)
HCT: 28.6 % — ABNORMAL LOW (ref 39.0–52.0)
Hemoglobin: 9.6 g/dL — ABNORMAL LOW (ref 13.0–17.0)
Hemoglobin: 9.8 g/dL — ABNORMAL LOW (ref 13.0–17.0)
MCH: 31.1 pg (ref 26.0–34.0)
MCH: 31 pg (ref 26.0–34.0)
MCHC: 34.3 g/dL (ref 30.0–36.0)
MCHC: 34.3 g/dL (ref 30.0–36.0)
MCV: 90.6 fL (ref 78.0–100.0)
MCV: 90.5 fL (ref 78.0–100.0)
PLATELETS: 142 10*3/uL — AB (ref 150–400)
Platelets: 142 10*3/uL — ABNORMAL LOW (ref 150–400)
RBC: 3.09 MIL/uL — ABNORMAL LOW (ref 4.22–5.81)
RBC: 3.16 MIL/uL — ABNORMAL LOW (ref 4.22–5.81)
RDW: 13.2 % (ref 11.5–15.5)
RDW: 13 % (ref 11.5–15.5)
WBC: 6.1 10*3/uL (ref 4.0–10.5)
WBC: 5.6 10*3/uL (ref 4.0–10.5)

## 2018-03-13 MED ORDER — TRAMADOL HCL 50 MG PO TABS
50.0000 mg | ORAL_TABLET | Freq: Four times a day (QID) | ORAL | Status: DC
  Administered 2018-03-13 – 2018-03-15 (×10): 50 mg via ORAL
  Filled 2018-03-13 (×11): qty 1

## 2018-03-13 MED ORDER — NICOTINE 7 MG/24HR TD PT24
7.0000 mg | MEDICATED_PATCH | Freq: Every day | TRANSDERMAL | Status: DC
  Administered 2018-03-13 – 2018-03-15 (×3): 7 mg via TRANSDERMAL
  Filled 2018-03-13 (×3): qty 1

## 2018-03-13 MED ORDER — ALPRAZOLAM 0.5 MG PO TABS
0.5000 mg | ORAL_TABLET | Freq: Three times a day (TID) | ORAL | Status: DC | PRN
  Administered 2018-03-13 – 2018-03-15 (×6): 0.5 mg via ORAL
  Filled 2018-03-13 (×6): qty 1

## 2018-03-13 NOTE — Progress Notes (Signed)
Late entry note.   Per Pharmacist Mardelle MatteAndy progress note written according to versed waste in pyxis for chest tube placement earlier in day.  Correct amount of versed given, unable to alter orders at this time to match pyxis--pyxis still asking for waste, unable to waste as all 4mg  were given. Total of 4mg  versed given, verified with Cora CollumShelli Coggins, RN and [facility] appropriately [facility].
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
## 2018-03-13 NOTE — Progress Notes (Signed)
CSW received consult regarding substance use. CSW spoke with patient and completed SBIRT. Patient reports living with his fiance. Patient states that he does not drink much alcohol except socially and he used to drink a lot more when he turned 19 but started having issues so he reduced his drinking. He states he has memory loss issues when he takes too much Xanax which makes him irritable according to his family. CSW also asked about illegal substances but patient denied use. CSW pressed further and asked about cocaine use but patient stated that he tries to distance himself from people that use drugs. CSW offered resources to patient but patient politely declined.   CSW signing off.  Osborne Cascoadia Kymiah Araiza LCSW [phone]
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
## 2018-03-14 ENCOUNTER — Inpatient Hospital Stay (HOSPITAL_COMMUNITY): Payer: BLUE CROSS/BLUE SHIELD

## 2018-03-14 LAB — CBC
HCT: 27.2 % — ABNORMAL LOW (ref 39.0–52.0)
HEMOGLOBIN: 9.1 g/dL — AB (ref 13.0–17.0)
MCH: 29.7 pg (ref 26.0–34.0)
MCHC: 33.5 g/dL (ref 30.0–36.0)
MCV: 88.9 fL (ref 78.0–100.0)
PLATELETS: 154 10*3/uL (ref 150–400)
RBC: 3.06 MIL/uL — AB (ref 4.22–5.81)
RDW: 12.9 % (ref 11.5–15.5)
WBC: 5.9 10*3/uL (ref 4.0–10.5)
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
## 2018-03-15 ENCOUNTER — Other Ambulatory Visit: Payer: Self-pay

## 2018-03-15 ENCOUNTER — Inpatient Hospital Stay (HOSPITAL_COMMUNITY): Payer: BLUE CROSS/BLUE SHIELD

## 2018-03-15 ENCOUNTER — Encounter (HOSPITAL_COMMUNITY): Payer: Self-pay

## 2018-03-15 LAB — CBC
HCT: 34.1 % — ABNORMAL LOW (ref 39.0–52.0)
HEMOGLOBIN: 11.4 g/dL — AB (ref 13.0–17.0)
MCH: 29.7 pg (ref 26.0–34.0)
MCHC: 33.4 g/dL (ref 30.0–36.0)
MCV: 88.8 fL (ref 78.0–100.0)
PLATELETS: 198 10*3/uL (ref 150–400)
RBC: 3.84 MIL/uL — AB (ref 4.22–5.81)
RDW: 12.9 % (ref 11.5–15.5)
WBC: 5.7 10*3/uL (ref 4.0–10.5)

## 2018-03-15 MED ORDER — METHOCARBAMOL 500 MG PO TABS: 500.0000 mg | ORAL_TABLET | Freq: Three times a day (TID) | ORAL | 0 refills | Status: AC | PRN

## 2018-03-15 MED ORDER — OXYCODONE HCL 5 MG PO TABS: 5.0000 mg | ORAL_TABLET | Freq: Four times a day (QID) | ORAL | 0 refills | Status: DC | PRN
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
## 2018-03-15 NOTE — Progress Notes (Signed)
Educated patient on positive coping skills as a substitute for pharmacological and street medications.

## 2018-03-15 NOTE — Progress Notes (Addendum)
Patient discharged home with girlfriend. Patient verbalized understanding of discharge instructions including upcoming appointments.  IVs removed. Patient packed personal belongings and ambulated to pov. [employer] Kathlee Barnhardt RN, 03/15/2018. 8:46 PM

## 2018-03-15 NOTE — Discharge Instructions (Signed)
Hemothorax Hemothorax is a buildup of blood between your lung and the wall of your chest (pleural cavity). It is usually caused by an injury that results in bleeding. Hemothorax can be a dangerous condition. As blood builds up in the pleural cavity, it can press on your lung and make it hard for you to breathe. Your lung may collapse. This means that air leaks from your lung and builds up in your pleural cavity(pneumothorax). This prevents your lung from expanding. What are the causes? An injury (trauma) that causes a tear in a lung or in a blood vessel in the chest is the main cause of hemothorax. Other possible causes include:  Tuberculosis.  An injury caused by placing a tube into a blood vessel in the chest (central venous catheter).  Cancer in the chest.  A blood-clotting problem.  Blood-thinning medicine.  Lung or heart surgery.  What are the signs or symptoms? Signs and symptoms may include:  Rapid breathing.  Difficulty breathing.  Shortness of breath.  Feeling light-headed.  Anxiety.  Restlessness.  A rapid heart rate.  Low blood pressure (hypotension).  Chest pain.  Cool, pale, blue, or sweaty skin.  How is this diagnosed? Your health care provider may suspect a hemothorax from your signs and symptoms, especially if you had a recent injury. Your health care provider will also do a physical exam. This includes checking your breathing. You may also have a chest X-ray. If the cause of the hemothorax is not known, fluid from the pleural space may be removed for testing. How is this treated? You may have an IV line started to give you fluids or blood from a donor (transfusion). Treatment usually includes placing a small, flexible tube (chest tube) into the pleural cavity to drain fluid, blood, or extra air. A chest tube can also re-expand your lung if it collapses. If bleeding continues after the chest tube is in place, you may need surgery to open the chest  (thoracotomy) and control the bleeding. This information is not intended to replace advice given to you by your health care provider. Make sure you discuss any questions you have with your health care provider. Document Released: 08/22/2004 Document Revised: 05/03/2016 Document Reviewed: 09/01/2014 Elsevier Interactive Patient Education  2018 [employer]    Blunt Chest Trauma Blunt chest trauma is an injury that is caused by a hard, direct hit (blow) to the chest. The blow can be strong enough to injure multiple body parts. Blunt chest trauma often results in bruised or broken (fractured) ribs. In many cases, the soft tissue in the chest wall is also injured, and this causes pain and bruising. Internal organs, such as the heart and lungs, can become injured as well. Blunt chest trauma can lead to serious medical problems. This injury requires immediate medical care. What are the causes? Causes of blunt chest trauma include:  Motor vehicle collisions.  Falls.  Physical violence.  Sports injuries.  What are the signs or symptoms? Symptoms of this condition include:  Chest pain. The pain may be worse when you move or breathe deeply.  Shortness of breath.  Light-headedness.  Bruising.  Tenderness.  Swelling.  How is this diagnosed? This condition is diagnosed with a medical history and physical exam. You may also have imaging tests, including:  X-rays.  Ultrasounds.  CT scans.  MRI.  How is this treated? Treatment for this condition varies depending on the type of injury you have and its severity. You may need to stay  in the hospital until you recover. Treatment may include:  Pain medicine. You may be given pain medicine through an IV tube at first. You may also be given over-the-counter and  prescription pain relievers.  Tubes or other devices to help you breathe.  Surgery to repair broken ribs and fix the surrounding damaged tissue and organs.  Follow these  instructions at home:  If directed, apply ice to the injured area: ? Put ice in a plastic bag. ? Place a towel between your skin and the bag. ? Leave the ice on for 20 minutes, 2-3 times per day.  Take over-the-counter and prescription medicines only as told by your health care provider.  Keep all follow-up visits as told by your health care provider. This is important. Contact a health care provider if:  Your pain gets worse after treatment.  You have nausea or you vomit.  You have pain in your abdomen.  You have a fever.  You feel dizzy or weak. Get help right away if:  You have shortness of breath.  You cough up blood.  You faint.  You have severe chest pain. This may come with other symptoms, such as: ? Dizziness. ? Shortness of breath. ? Pain in your neck, jaw, or back, or in one arm or both arms. This information is not intended to replace advice given to you by your health care provider. Make sure you discuss any questions you have with your health care provider. Document Released: 01/03/2005 Document Revised: 05/09/2016 Document Reviewed: 05/25/2015 Elsevier Interactive Patient Education  2017 [employer].
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
## 2018-03-24 ENCOUNTER — Ambulatory Visit
Admission: RE | Admit: 2018-03-24 | Discharge: 2018-03-24 | Disposition: A | Discharge status: Home / Self Care | Payer: BLUE CROSS/BLUE SHIELD | Source: Ambulatory Visit | Attending: Physician Assistant | Admitting: Physician Assistant

## 2018-03-24 ENCOUNTER — Other Ambulatory Visit: Payer: Self-pay | Admitting: Physician Assistant

## 2018-03-24 DIAGNOSIS — S271XXA Traumatic hemothorax, initial encounter: Secondary | ICD-10-CM

## 2018-07-08 ENCOUNTER — Encounter (HOSPITAL_COMMUNITY): Payer: Self-pay | Admitting: *Deleted

## 2018-07-08 ENCOUNTER — Emergency Department (HOSPITAL_COMMUNITY)
Admission: EM | Admit: 2018-07-08 | Discharge: 2018-07-08 | Disposition: A | Discharge status: Home / Self Care | Payer: BLUE CROSS/BLUE SHIELD | Attending: Emergency Medicine | Admitting: Emergency Medicine

## 2018-07-08 ENCOUNTER — Ambulatory Visit (HOSPITAL_COMMUNITY): Payer: Self-pay

## 2018-07-08 DIAGNOSIS — F17210 Nicotine dependence, cigarettes, uncomplicated: Secondary | ICD-10-CM | POA: Insufficient documentation

## 2018-07-08 DIAGNOSIS — Z79899 Other long term (current) drug therapy: Secondary | ICD-10-CM | POA: Insufficient documentation

## 2018-07-08 DIAGNOSIS — F4325 Adjustment disorder with mixed disturbance of emotions and conduct: Secondary | ICD-10-CM | POA: Diagnosis present

## 2018-07-08 DIAGNOSIS — Z0289 Encounter for other administrative examinations: Secondary | ICD-10-CM | POA: Insufficient documentation

## 2018-07-08 DIAGNOSIS — F419 Anxiety disorder, unspecified: Secondary | ICD-10-CM | POA: Insufficient documentation

## 2018-07-08 HISTORY — DX: Other psychoactive substance abuse, uncomplicated: F19.10

## 2018-07-08 LAB — CBC
HEMATOCRIT: 45 % (ref 39.0–52.0)
HEMOGLOBIN: 15.9 g/dL (ref 13.0–17.0)
MCH: 30.5 pg (ref 26.0–34.0)
MCHC: 35.3 g/dL (ref 30.0–36.0)
MCV: 86.4 fL (ref 78.0–100.0)
Platelets: 258 10*3/uL (ref 150–400)
RBC: 5.21 MIL/uL (ref 4.22–5.81)
RDW: 13 % (ref 11.5–15.5)
WBC: 5.9 10*3/uL (ref 4.0–10.5)

## 2018-07-08 LAB — SALICYLATE LEVEL

## 2018-07-08 LAB — COMPREHENSIVE METABOLIC PANEL
ALT: 15 U/L (ref 0–44)
AST: 18 U/L (ref 15–41)
Albumin: 4.7 g/dL (ref 3.5–5.0)
Alkaline Phosphatase: 72 U/L (ref 38–126)
Anion gap: 10 (ref 5–15)
BUN: 9 mg/dL (ref 6–20)
CO2: 23 mmol/L (ref 22–32)
Calcium: 9.8 mg/dL (ref 8.9–10.3)
Chloride: 109 mmol/L (ref 98–111)
Creatinine, Ser: 1.04 mg/dL (ref 0.61–1.24)
GFR calc Af Amer: >60 mL/min (ref >60)
GFR calc non Af Amer: >60 mL/min (ref >60)
Glucose, Bld: 153 mg/dL — ABNORMAL HIGH (ref 70–99)
Potassium: 3.9 mmol/L (ref 3.5–5.1)
Sodium: 142 mmol/L (ref 135–145)
Total Bilirubin: 0.9 mg/dL (ref 0.3–1.2)
Total Protein: 8 g/dL (ref 6.5–8.1)

## 2018-07-08 LAB — RAPID URINE DRUG SCREEN, HOSP PERFORMED
Amphetamines: POSITIVE — AB
Barbiturates: NOT DETECTED
Benzodiazepines: NOT DETECTED
Cocaine: NOT DETECTED
Opiates: NOT DETECTED
Tetrahydrocannabinol: POSITIVE — AB

## 2018-07-08 LAB — ACETAMINOPHEN LEVEL: Acetaminophen (Tylenol), Serum: <10 ug/mL — ABNORMAL LOW (ref 10–30)

## 2018-07-08 LAB — ETHANOL: Alcohol, Ethyl (B): <10 mg/dL (ref <10)

## 2018-07-08 MED ORDER — ALUM & MAG HYDROXIDE-SIMETH 200-200-20 MG/5ML PO SUSP: 30.0000 mL | Freq: Four times a day (QID) | ORAL | Status: DC | PRN

## 2018-07-08 MED ORDER — ONDANSETRON HCL 4 MG PO TABS: 4.0000 mg | ORAL_TABLET | Freq: Three times a day (TID) | ORAL | Status: DC | PRN

## 2018-07-08 MED ORDER — ZOLPIDEM TARTRATE 5 MG PO TABS: 5.0000 mg | ORAL_TABLET | Freq: Every evening | ORAL | Status: DC | PRN

## 2018-07-08 MED ORDER — NICOTINE 21 MG/24HR TD PT24: 21.0000 mg | MEDICATED_PATCH | Freq: Every day | TRANSDERMAL | Status: DC

## 2018-07-08 MED ORDER — ACETAMINOPHEN 325 MG PO TABS: 650.0000 mg | ORAL_TABLET | ORAL | Status: DC | PRN
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
## 2018-07-08 NOTE — ED Notes (Signed)
GF at bedside

## 2018-07-08 NOTE — Discharge Instructions (Signed)
Return to the ER with new or worsening symptoms or any other concerns.

## 2018-07-08 NOTE — ED Notes (Signed)
Bed: [facility]39 Expected date:  Expected time:  Means of arrival:  Comments: 28 
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
## 2019-04-04 IMAGING — CR DG CHEST 2V
2 series · 2 of 2 positions shown · non-contrast
Comparison: None.

CLINICAL DATA: Productive cough with some blood in it today.

EXAM:
CHEST  2 VIEW

[w chest pa]
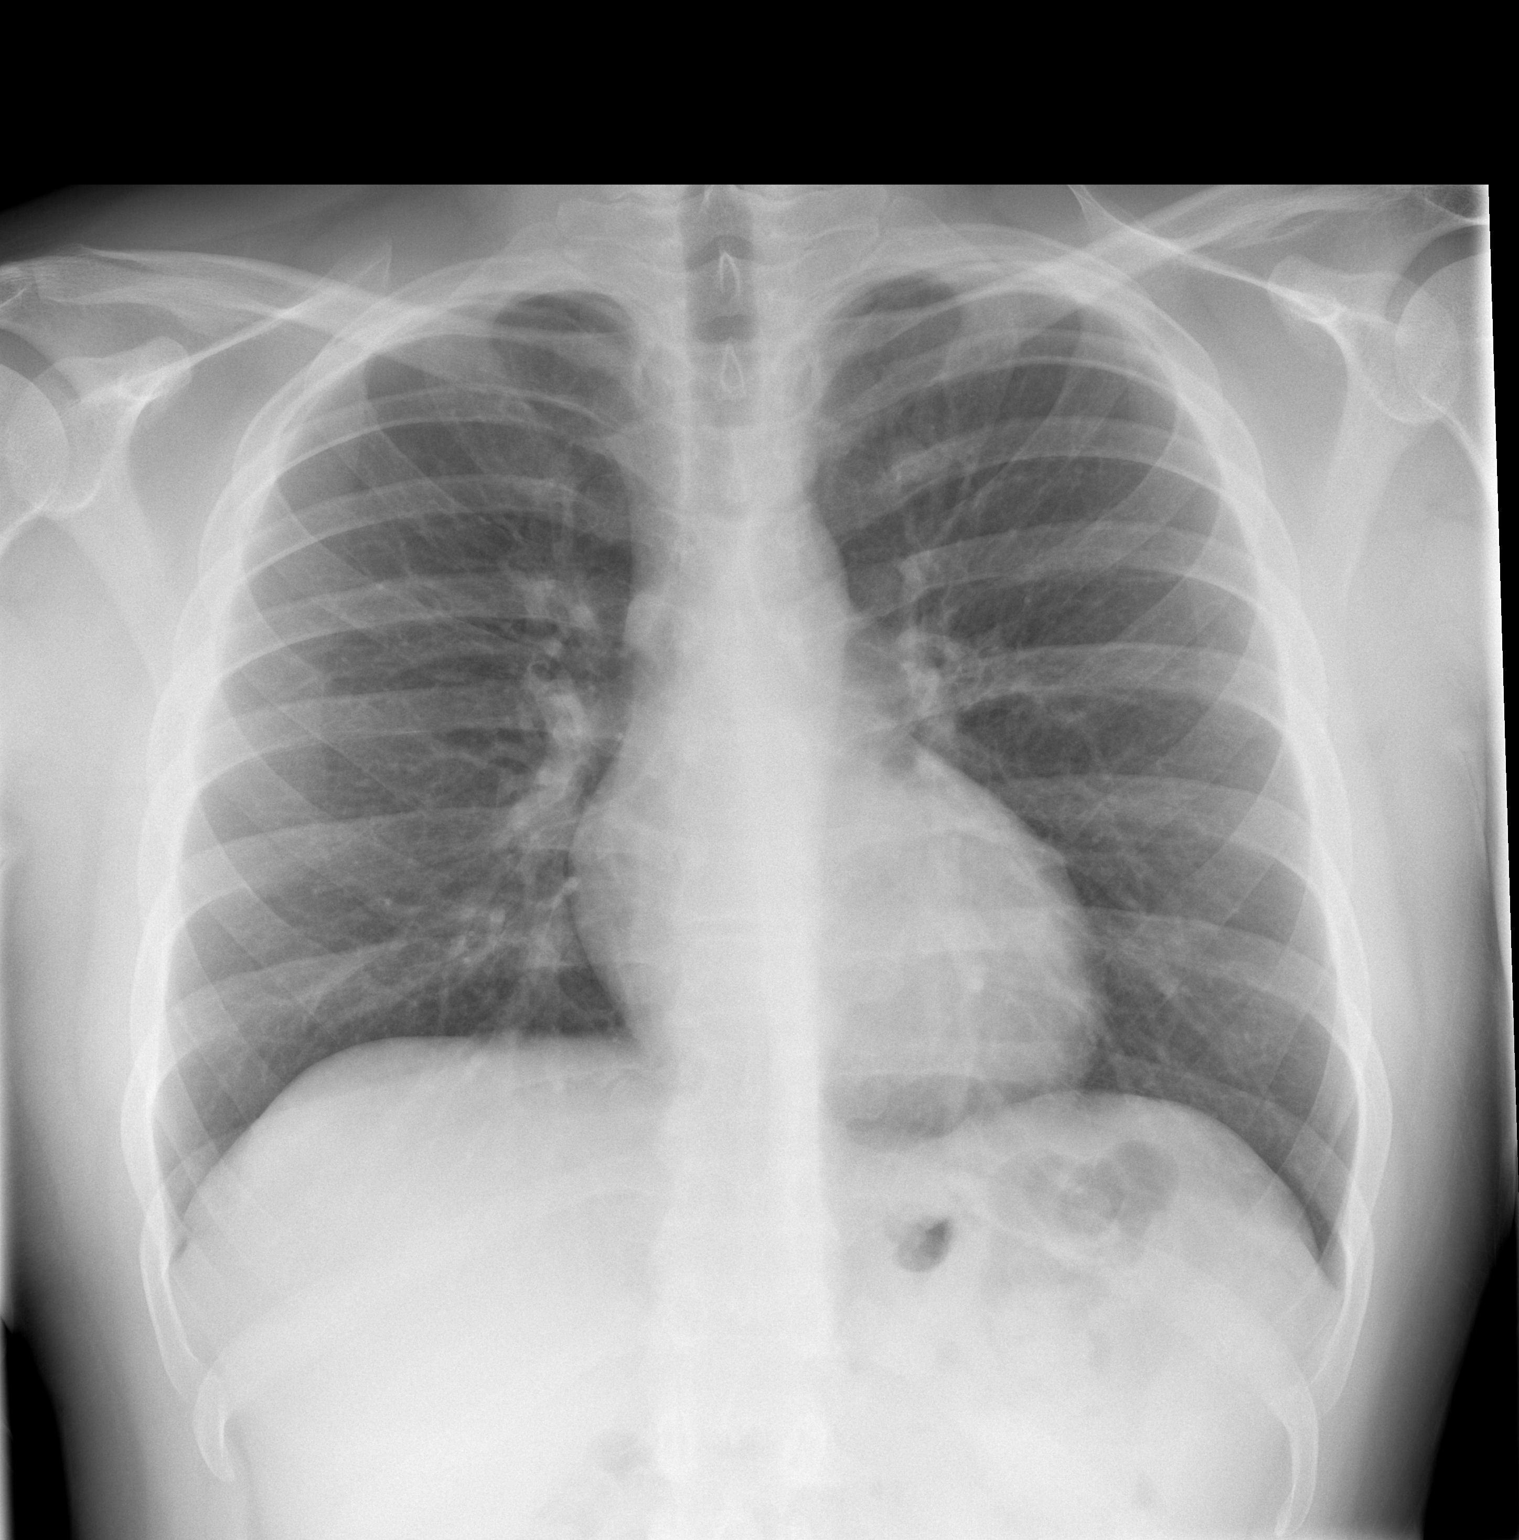

[w chest lat]
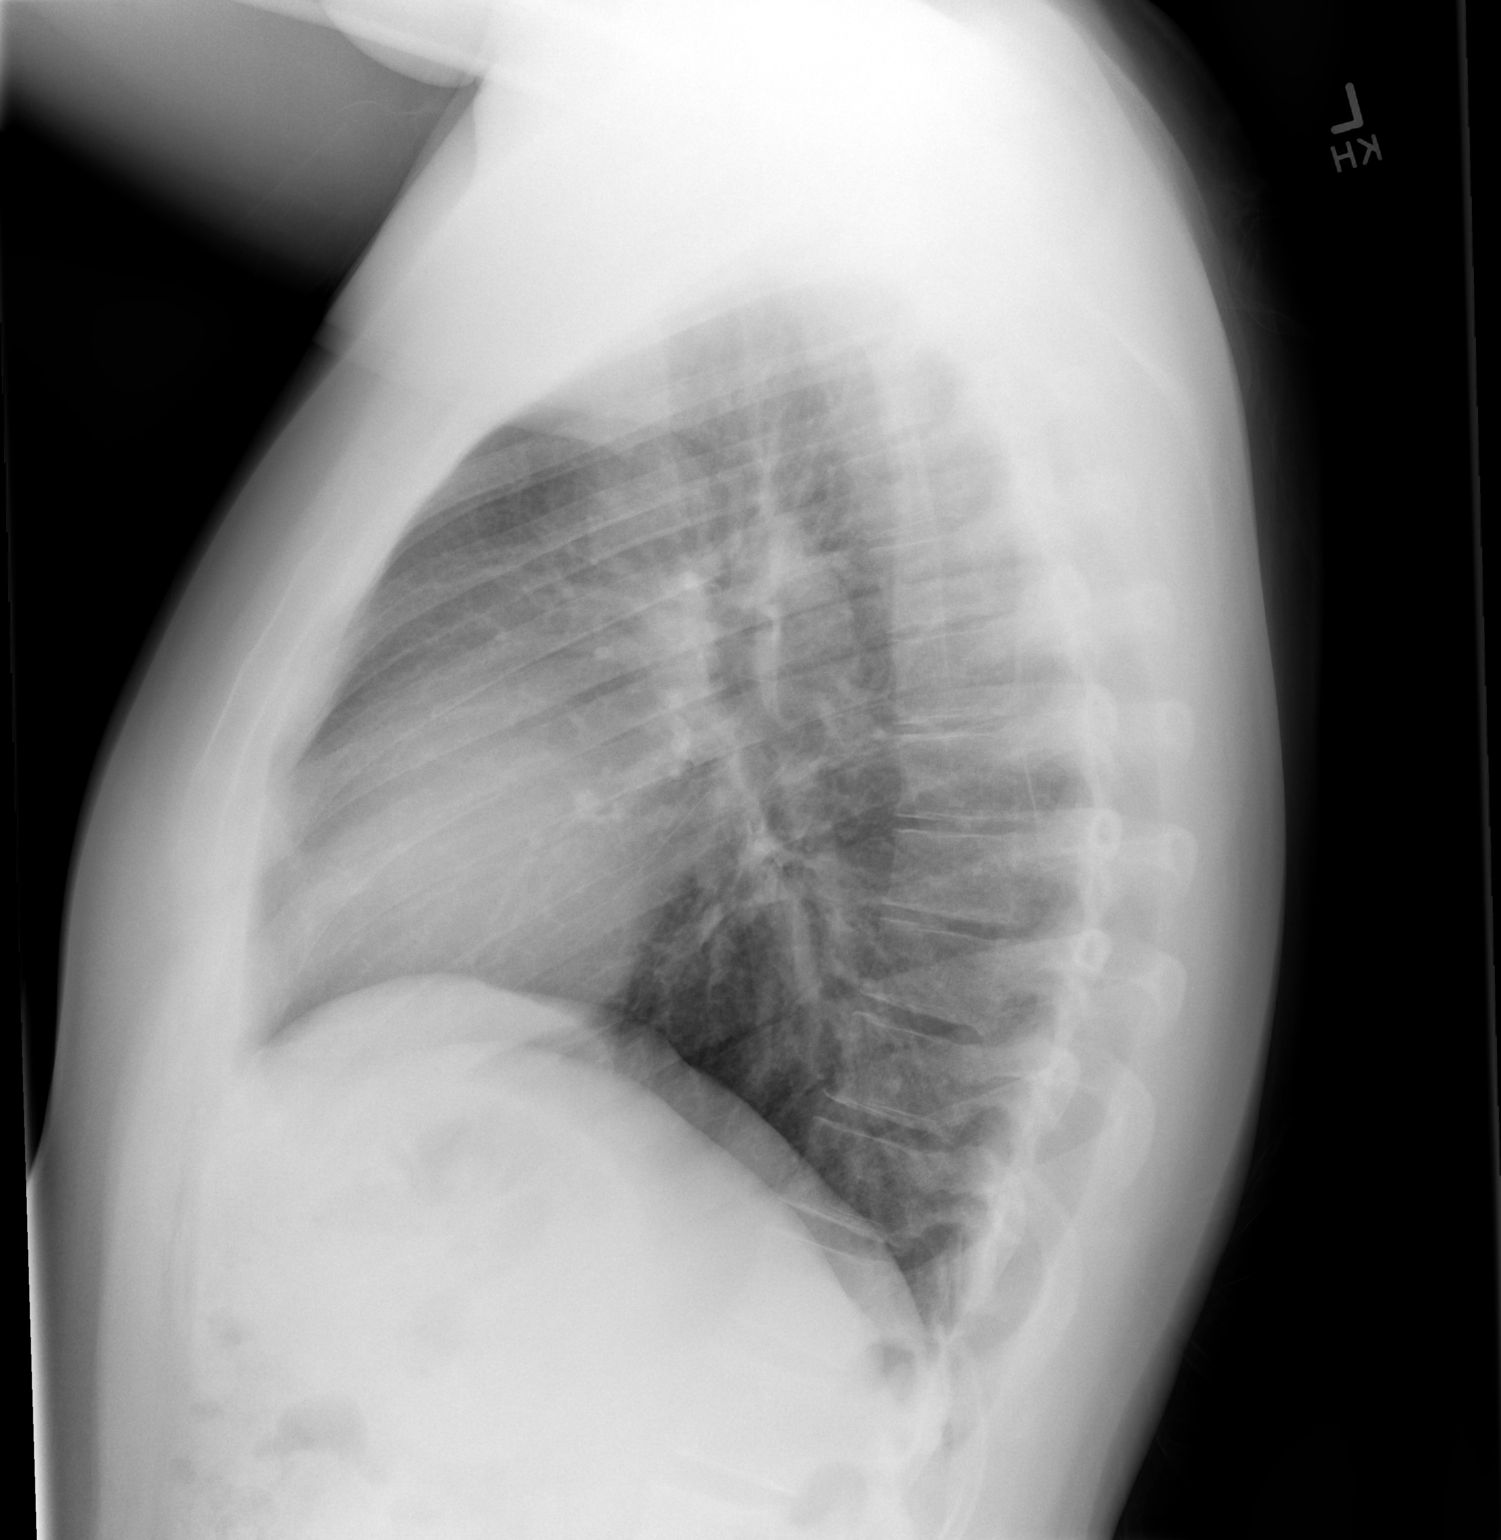

[2 of 2 positions shown; findings below may reference images not displayed]

FINDINGS: The lungs are clear. The pulmonary vasculature is normal. Heart size
is normal. Hilar and mediastinal contours are unremarkable. There is
no pleural effusion.
IMPRESSION: No active cardiopulmonary disease.
